# Patient Record
Sex: Female | Born: 1980 | Race: White | Hispanic: No | Marital: Married | Smoking: Never smoker
Health system: Southern US, Community
[De-identification: ages and names within clinical notes are randomized; demographics above are authoritative.]

## PROBLEM LIST (undated history)

## (undated) DIAGNOSIS — Z789 Other specified health status: Secondary | ICD-10-CM

## (undated) HISTORY — PX: NO PAST SURGERIES: SHX2092

---

## 2014-01-07 LAB — OB RESULTS CONSOLE HEPATITIS B SURFACE ANTIGEN: HEP B S AG: NEGATIVE

## 2014-01-07 LAB — OB RESULTS CONSOLE ABO/RH: RH Type: POSITIVE

## 2014-01-07 LAB — OB RESULTS CONSOLE HIV ANTIBODY (ROUTINE TESTING): HIV: NONREACTIVE

## 2014-01-07 LAB — OB RESULTS CONSOLE ANTIBODY SCREEN: Antibody Screen: NEGATIVE

## 2014-01-07 LAB — OB RESULTS CONSOLE RUBELLA ANTIBODY, IGM: RUBELLA: IMMUNE

## 2014-01-07 LAB — OB RESULTS CONSOLE RPR: RPR: NONREACTIVE

## 2014-04-16 NOTE — L&D Delivery Note (Signed)
SVD of VFI at 1729 on 07/19/14.  EBL 250cc.  APGARs 9,9.  Placenta to L&D. Head delivered LOA and body delivered atraumatically.  Cord was clamped, cut and baby to abdomen.  Cord blood was obtained.  Placenta delivered S/I/3VC.  Fundus was firmed with pitocin and massage.  2nd degree perineal lac was repaired in the normal fashion.  Mom and baby stable.    Mitchel HonourMegan Nira Visscher, DO

## 2014-06-15 LAB — OB RESULTS CONSOLE GBS: STREP GROUP B AG: NEGATIVE

## 2014-06-30 ENCOUNTER — Inpatient Hospital Stay (HOSPITAL_COMMUNITY): Admission: AD | Admit: 2014-06-30 | Payer: Self-pay | Source: Ambulatory Visit | Admitting: Obstetrics & Gynecology

## 2014-07-14 ENCOUNTER — Encounter (HOSPITAL_COMMUNITY): Payer: Self-pay | Admitting: *Deleted

## 2014-07-14 ENCOUNTER — Telehealth (HOSPITAL_COMMUNITY): Payer: Self-pay | Admitting: *Deleted

## 2014-07-14 NOTE — Telephone Encounter (Signed)
Preadmission screen  

## 2014-07-19 ENCOUNTER — Encounter (HOSPITAL_COMMUNITY): Payer: Self-pay

## 2014-07-19 ENCOUNTER — Inpatient Hospital Stay (HOSPITAL_COMMUNITY): Payer: Managed Care, Other (non HMO) | Admitting: Anesthesiology

## 2014-07-19 ENCOUNTER — Inpatient Hospital Stay (HOSPITAL_COMMUNITY)
Admission: RE | Admit: 2014-07-19 | Discharge: 2014-07-21 | DRG: 775 | Disposition: A | Discharge status: Home / Self Care | Payer: Managed Care, Other (non HMO) | Source: Ambulatory Visit | Attending: Obstetrics & Gynecology | Admitting: Obstetrics & Gynecology

## 2014-07-19 DIAGNOSIS — Z3A41 41 weeks gestation of pregnancy: Secondary | ICD-10-CM | POA: Diagnosis present

## 2014-07-19 DIAGNOSIS — Z823 Family history of stroke: Secondary | ICD-10-CM

## 2014-07-19 DIAGNOSIS — Z833 Family history of diabetes mellitus: Secondary | ICD-10-CM | POA: Diagnosis not present

## 2014-07-19 DIAGNOSIS — O480 Post-term pregnancy: Secondary | ICD-10-CM | POA: Diagnosis present

## 2014-07-19 DIAGNOSIS — Z3490 Encounter for supervision of normal pregnancy, unspecified, unspecified trimester: Secondary | ICD-10-CM

## 2014-07-19 HISTORY — DX: Other specified health status: Z78.9

## 2014-07-19 LAB — CBC
HCT: 34.1 % — ABNORMAL LOW (ref 36.0–46.0)
HEMOGLOBIN: 11.4 g/dL — AB (ref 12.0–15.0)
MCH: 29.9 pg (ref 26.0–34.0)
MCHC: 33.4 g/dL (ref 30.0–36.0)
MCV: 89.5 fL (ref 78.0–100.0)
Platelets: 235 10*3/uL (ref 150–400)
RBC: 3.81 MIL/uL — AB (ref 3.87–5.11)
RDW: 13 % (ref 11.5–15.5)
WBC: 11.6 10*3/uL — AB (ref 4.0–10.5)

## 2014-07-19 LAB — TYPE AND SCREEN
ABO/RH(D): B POS
Antibody Screen: NEGATIVE

## 2014-07-19 LAB — ABO/RH: ABO/RH(D): B POS

## 2014-07-19 LAB — RPR: RPR Ser Ql: NONREACTIVE

## 2014-07-19 MED ORDER — EPHEDRINE 5 MG/ML INJ
10.0000 mg | INTRAVENOUS | Status: DC | PRN
  Filled 2014-07-19: qty 2

## 2014-07-19 MED ORDER — LIDOCAINE HCL (PF) 1 % IJ SOLN
30.0000 mL | INTRAMUSCULAR | Status: DC | PRN
  Filled 2014-07-19 (×2): qty 30

## 2014-07-19 MED ORDER — TETANUS-DIPHTH-ACELL PERTUSSIS 5-2.5-18.5 LF-MCG/0.5 IM SUSP: 0.5000 mL | Freq: Once | INTRAMUSCULAR | Status: DC

## 2014-07-19 MED ORDER — ZOLPIDEM TARTRATE 5 MG PO TABS: 5.0000 mg | ORAL_TABLET | Freq: Every evening | ORAL | Status: DC | PRN

## 2014-07-19 MED ORDER — PHENYLEPHRINE 40 MCG/ML (10ML) SYRINGE FOR IV PUSH (FOR BLOOD PRESSURE SUPPORT)
80.0000 ug | PREFILLED_SYRINGE | INTRAVENOUS | Status: DC | PRN
  Filled 2014-07-19: qty 2

## 2014-07-19 MED ORDER — LIDOCAINE HCL (PF) 1 % IJ SOLN
INTRAMUSCULAR | Status: DC | PRN
  Administered 2014-07-19 (×2): 8 mL

## 2014-07-19 MED ORDER — PRENATAL MULTIVITAMIN CH
1.0000 | ORAL_TABLET | Freq: Every day | ORAL | Status: DC
  Administered 2014-07-20: 1 via ORAL
  Filled 2014-07-19: qty 1

## 2014-07-19 MED ORDER — SENNOSIDES-DOCUSATE SODIUM 8.6-50 MG PO TABS
2.0000 | ORAL_TABLET | ORAL | Status: DC
  Administered 2014-07-20 (×2): 2 via ORAL
  Filled 2014-07-19 (×2): qty 2

## 2014-07-19 MED ORDER — OXYTOCIN BOLUS FROM INFUSION: 500.0000 mL | INTRAVENOUS | Status: DC

## 2014-07-19 MED ORDER — DIBUCAINE 1 % RE OINT: 1.0000 "application " | TOPICAL_OINTMENT | RECTAL | Status: DC | PRN

## 2014-07-19 MED ORDER — SIMETHICONE 80 MG PO CHEW: 80.0000 mg | CHEWABLE_TABLET | ORAL | Status: DC | PRN

## 2014-07-19 MED ORDER — CITRIC ACID-SODIUM CITRATE 334-500 MG/5ML PO SOLN: 30.0000 mL | ORAL | Status: DC | PRN

## 2014-07-19 MED ORDER — LACTATED RINGERS IV SOLN: INTRAVENOUS | Status: DC

## 2014-07-19 MED ORDER — OXYCODONE-ACETAMINOPHEN 5-325 MG PO TABS
2.0000 | ORAL_TABLET | ORAL | Status: DC | PRN
Start: 2014-07-19 — End: 2014-07-21

## 2014-07-19 MED ORDER — FENTANYL 2.5 MCG/ML BUPIVACAINE 1/10 % EPIDURAL INFUSION (WH - ANES)
14.0000 mL/h | INTRAMUSCULAR | Status: DC | PRN
  Filled 2014-07-19: qty 125

## 2014-07-19 MED ORDER — LANOLIN HYDROUS EX OINT: TOPICAL_OINTMENT | CUTANEOUS | Status: DC | PRN

## 2014-07-19 MED ORDER — ACETAMINOPHEN 325 MG PO TABS: 650.0000 mg | ORAL_TABLET | ORAL | Status: DC | PRN

## 2014-07-19 MED ORDER — DIPHENHYDRAMINE HCL 50 MG/ML IJ SOLN: 12.5000 mg | INTRAMUSCULAR | Status: DC | PRN

## 2014-07-19 MED ORDER — ACETAMINOPHEN 325 MG PO TABS
650.0000 mg | ORAL_TABLET | ORAL | Status: DC | PRN
  Administered 2014-07-20 (×3): 650 mg via ORAL
  Filled 2014-07-19 (×3): qty 2

## 2014-07-19 MED ORDER — DIPHENHYDRAMINE HCL 25 MG PO CAPS: 25.0000 mg | ORAL_CAPSULE | Freq: Four times a day (QID) | ORAL | Status: DC | PRN

## 2014-07-19 MED ORDER — ONDANSETRON HCL 4 MG/2ML IJ SOLN: 4.0000 mg | INTRAMUSCULAR | Status: DC | PRN

## 2014-07-19 MED ORDER — LACTATED RINGERS IV SOLN: 500.0000 mL | Freq: Once | INTRAVENOUS | Status: DC

## 2014-07-19 MED ORDER — ONDANSETRON HCL 4 MG PO TABS: 4.0000 mg | ORAL_TABLET | ORAL | Status: DC | PRN

## 2014-07-19 MED ORDER — TERBUTALINE SULFATE 1 MG/ML IJ SOLN
0.2500 mg | Freq: Once | INTRAMUSCULAR | Status: DC | PRN
  Filled 2014-07-19: qty 1

## 2014-07-19 MED ORDER — OXYTOCIN 40 UNITS IN LACTATED RINGERS INFUSION - SIMPLE MED
1.0000 m[IU]/min | INTRAVENOUS | Status: DC
  Administered 2014-07-19: 2 m[IU]/min via INTRAVENOUS
  Filled 2014-07-19: qty 1000

## 2014-07-19 MED ORDER — OXYCODONE-ACETAMINOPHEN 5-325 MG PO TABS: 2.0000 | ORAL_TABLET | ORAL | Status: DC | PRN

## 2014-07-19 MED ORDER — PHENYLEPHRINE 40 MCG/ML (10ML) SYRINGE FOR IV PUSH (FOR BLOOD PRESSURE SUPPORT)
80.0000 ug | PREFILLED_SYRINGE | INTRAVENOUS | Status: DC | PRN
  Filled 2014-07-19: qty 2
  Filled 2014-07-19: qty 20

## 2014-07-19 MED ORDER — OXYCODONE-ACETAMINOPHEN 5-325 MG PO TABS: 1.0000 | ORAL_TABLET | ORAL | Status: DC | PRN

## 2014-07-19 MED ORDER — WITCH HAZEL-GLYCERIN EX PADS: 1.0000 "application " | MEDICATED_PAD | CUTANEOUS | Status: DC | PRN

## 2014-07-19 MED ORDER — BENZOCAINE-MENTHOL 20-0.5 % EX AERO: 1.0000 "application " | INHALATION_SPRAY | CUTANEOUS | Status: DC | PRN

## 2014-07-19 MED ORDER — FLEET ENEMA 7-19 GM/118ML RE ENEM: 1.0000 | ENEMA | RECTAL | Status: DC | PRN

## 2014-07-19 MED ORDER — LACTATED RINGERS IV SOLN: 500.0000 mL | INTRAVENOUS | Status: DC | PRN

## 2014-07-19 MED ORDER — FENTANYL 2.5 MCG/ML BUPIVACAINE 1/10 % EPIDURAL INFUSION (WH - ANES)
INTRAMUSCULAR | Status: DC | PRN
  Administered 2014-07-19: 14 mL/h via EPIDURAL

## 2014-07-19 MED ORDER — ONDANSETRON HCL 4 MG/2ML IJ SOLN: 4.0000 mg | Freq: Four times a day (QID) | INTRAMUSCULAR | Status: DC | PRN

## 2014-07-19 MED ORDER — OXYTOCIN 40 UNITS IN LACTATED RINGERS INFUSION - SIMPLE MED: 62.5000 mL/h | INTRAVENOUS | Status: DC

## 2014-07-19 MED ORDER — OXYCODONE-ACETAMINOPHEN 5-325 MG PO TABS
1.0000 | ORAL_TABLET | ORAL | Status: DC | PRN
Start: 2014-07-19 — End: 2014-07-21

## 2014-07-19 NOTE — Lactation Note (Signed)
This note was copied from the chart of Ashlee Cheri GuppySarah Abruzzese. Lactation Consultation Note Initial visit at 4 hours of age.  Mom has 14 months experience with older child breastfeeding.  Mom reports this baby has had 2 feedings and does well with latching deeply.  Mom denies pain or concerns.  Baby asleep in mom's arms.  [facility] given and discussed.  Encouraged to feed with early cues on demand.  Early newborn behavior discussed.  Hand expression demonstrated with colostrum visible.  Mom to call for assist as needed.     Patient Name: Ashlee Phillips ZOXWR'UToday's Date: 07/19/2014 Reason for consult: Initial assessment   Maternal Data Formula Feeding for Exclusion: No Has patient been taught Hand Expression?: Yes Does the patient have breastfeeding experience prior to this delivery?: Yes  Feeding Feeding Type: Breast Fed Length of feed: 30 min  LATCH Score/Interventions                      Lactation Tools Discussed/Used     Consult Status Consult Status: Follow-up Date: 07/20/14 Follow-up type: In-patient    Jannifer RodneyShoptaw, Jana Lynn 07/19/2014, 10:23 PM

## 2014-07-19 NOTE — Anesthesia Procedure Notes (Signed)
Epidural Patient location during procedure: OB Start time: 07/19/2014 2:05 PM End time: 07/19/2014 2:09 PM  Staffing Anesthesiologist: Leilani AbleHATCHETT, Milfred Krammes Performed by: anesthesiologist   Preanesthetic Checklist Completed: patient identified, surgical consent, pre-op evaluation, timeout performed, IV checked, risks and benefits discussed and monitors and equipment checked  Epidural Patient position: sitting Prep: site prepped and draped and DuraPrep Patient monitoring: continuous pulse ox and blood pressure Approach: midline Location: L3-L4 Injection technique: LOR air  Needle:  Needle type: Tuohy  Needle gauge: 17 G Needle length: 9 cm and 9 Needle insertion depth: 5 cm cm Catheter type: closed end flexible Catheter size: 19 Gauge Catheter at skin depth: 10 cm Test dose: negative and Other  Assessment Sensory level: T8 Events: blood not aspirated, injection not painful, no injection resistance, negative IV test and no paresthesia  Additional Notes Reason for block:procedure for pain

## 2014-07-19 NOTE — Anesthesia Preprocedure Evaluation (Signed)

## 2014-07-19 NOTE — H&P (Signed)
Ashlee GuppySarah Phillips is a 34 y.o. female presenting for post-dates IOL.  Antepartum course complicated by h/o 4th degree laceration; declines elective primary C/S.  H/O macrosomia with last pregnancy; u/s 3/1 showed 72% growth (6#7).  GBS negative.    Maternal Medical History:  Fetal activity: Perceived fetal activity is normal.   Last perceived fetal movement was within the past hour.    Prenatal complications: no prenatal complications Prenatal Complications - Diabetes: none.    OB History    Gravida Para Term Preterm AB TAB SAB Ectopic Multiple Living   2 1 1       1      Past Medical History  Diagnosis Date  . Medical history non-contributory    Past Surgical History  Procedure Laterality Date  . No past surgeries     Family History: family history includes Cancer in her paternal grandfather; Diabetes in her father and paternal grandfather; Stroke in her paternal grandfather. Social History:  reports that she has never smoked. She does not have any smokeless tobacco history on file. She reports that she does not drink alcohol or use illicit drugs.   Prenatal Transfer Tool  Maternal Diabetes: No Genetic Screening: Normal Maternal Ultrasounds/Referrals: Normal Fetal Ultrasounds or other Referrals:  None Maternal Substance Abuse:  No Significant Maternal Medications:  None Significant Maternal Lab Results:  Lab values include: Group B Strep negative Other Comments:  None  ROS  Dilation: 3 Effacement (%): 50 Station: -2 Exam by:: dr Langston Maskermorris  Blood pressure 111/69, pulse 95, temperature 98.4 F (36.9 C), temperature source Oral, resp. rate 20, height 5\' 5"  (1.651 m), weight 161 lb (73.029 kg), last menstrual period 10/05/2013. Maternal Exam:  Uterine Assessment: Contraction strength is mild.  Contraction frequency is rare.   Abdomen: Patient reports no abdominal tenderness. Fundal height is c/w dates.   Estimated fetal weight is 8#.   Fetal presentation: vertex  Introitus:  Normal vulva. Amniotic fluid character: clear.  Pelvis: adequate for delivery.   Cervix: Cervix evaluated by digital exam.     Physical Exam  Constitutional: She is oriented to person, place, and time. She appears well-developed and well-nourished.  GI: Soft. There is no rebound and no guarding.  Neurological: She is alert and oriented to person, place, and time.  Skin: Skin is warm and dry.  Psychiatric: She has a normal mood and affect. Her behavior is normal.    Prenatal labs: ABO, Rh: B/Positive/-- (09/24 0000) Antibody: Negative (09/24 0000) Rubella: Immune (09/24 0000) RPR: Nonreactive (09/24 0000)  HBsAg: Negative (09/24 0000)  HIV: Non-reactive (09/24 0000)  GBS: Negative (03/01 0000)   Assessment/Plan: 33yo G2P1001 at [id number] for PDI -AROM performed -Start pitocin as needed -Epidural if desired -GBS negative   Ashlee Phillips 07/19/2014, 8:24 AM

## 2014-07-20 LAB — CBC
HEMATOCRIT: 32.8 % — AB (ref 36.0–46.0)
HEMOGLOBIN: 10.6 g/dL — AB (ref 12.0–15.0)
MCH: 29.1 pg (ref 26.0–34.0)
MCHC: 32.3 g/dL (ref 30.0–36.0)
MCV: 90.1 fL (ref 78.0–100.0)
PLATELETS: 220 10*3/uL (ref 150–400)
RBC: 3.64 MIL/uL — AB (ref 3.87–5.11)
RDW: 13.1 % (ref 11.5–15.5)
WBC: 18.5 10*3/uL — ABNORMAL HIGH (ref 4.0–10.5)

## 2014-07-20 NOTE — Progress Notes (Signed)
Post Partum Day 1 Subjective: no complaints, up ad lib, voiding, tolerating PO and + flatus  Objective: Blood pressure 114/70, pulse 76, temperature 98.4 F (36.9 C), temperature source Oral, resp. rate 18, height 5\' 5"  (1.651 m), weight 161 lb (73.029 kg), last menstrual period 10/05/2013, SpO2 98 %, unknown if currently breastfeeding.  Physical Exam:  General: alert and cooperative Lochia: appropriate Uterine Fundus: firm Incision: healing well DVT Evaluation: No evidence of DVT seen on physical exam. Negative Homan's sign. No cords or calf tenderness. No significant calf/ankle edema.   Recent Labs  07/19/14 0745 07/20/14 0546  HGB 11.4* 10.6*  HCT 34.1* 32.8*    Assessment/Plan: Plan for discharge tomorrow   LOS: 1 day   CURTIS,CAROL G 07/20/2014, 8:30 AM

## 2014-07-20 NOTE — Anesthesia Postprocedure Evaluation (Signed)
  Anesthesia Post-op Note  Patient: Ashlee GuppySarah Phillips  Procedure(s) Performed: * No procedures listed *  Patient Location: Mother/Baby  Anesthesia Type:Epidural  Level of Consciousness: awake, alert , oriented and patient cooperative  Airway and Oxygen Therapy: Patient Spontanous Breathing  Post-op Pain: mild  Post-op Assessment: Post-op Vital signs reviewed, Patient's Cardiovascular Status Stable, Respiratory Function Stable, Patent Airway, No signs of Nausea or vomiting, Adequate PO intake, Pain level controlled, No headache, No backache, No residual numbness and No residual motor weakness  Post-op Vital Signs: Reviewed and stable  Last Vitals:  Filed Vitals:   07/20/14 0916  BP: 115/74  Pulse: 83  Temp: 36.8 C  Resp: 18    Complications: No apparent anesthesia complications

## 2014-07-21 ENCOUNTER — Ambulatory Visit: Payer: Self-pay

## 2014-07-21 NOTE — Lactation Note (Signed)
This note was copied from the chart of Ashlee Cheri GuppySarah Jicha. Lactation Consultation Note: Mother was seen piror to discharge. Discussed need to continue to feed infant 8-12 times and with feeding cues. Mother informed of treatment plan to prevent severe engorgement. Mother receptive to all teaching. Mother is aware of available LC services.   Patient Name: Ashlee Phillips ZOXWR'UToday's Date: 07/21/2014 Reason for consult: Follow-up assessment   Maternal Data    Feeding Feeding Type: Breast Fed Length of feed: 20 min  LATCH Score/Interventions                      Lactation Tools Discussed/Used     Consult Status Consult Status: Complete    Michel BickersKendrick, Teneka Malmberg McCoy 07/21/2014, 12:24 PM

## 2014-07-21 NOTE — Discharge Summary (Signed)
Obstetric Discharge Summary Reason for Admission: induction of labor Prenatal Procedures: ultrasound Intrapartum Procedures: spontaneous vaginal delivery Postpartum Procedures: none Complications-Operative and Postpartum: 2 degree perineal laceration HEMOGLOBIN  Date Value Ref Range Status  07/20/2014 10.6* 12.0 - 15.0 g/dL Final   HCT  Date Value Ref Range Status  07/20/2014 32.8* 36.0 - 46.0 % Final    Physical Exam:  General: alert and cooperative Lochia: appropriate Uterine Fundus: firm Incision: healing well DVT Evaluation: No evidence of DVT seen on physical exam. Negative Homan's sign. No cords or calf tenderness. No significant calf/ankle edema.  Discharge Diagnoses: Term Pregnancy-delivered  Discharge Information: Date: 07/21/2014 Activity: pelvic rest Diet: routine Medications: PNV Condition: stable Instructions: refer to practice specific booklet Discharge to: home   Newborn Data: Live born female  Birth Weight: 8 lb 11.7 oz (3960 g) APGAR: 9, 9  Home with mother.  CURTIS,CAROL G 07/21/2014, 8:46 AM

## 2015-03-15 ENCOUNTER — Other Ambulatory Visit: Payer: Self-pay | Admitting: Nurse Practitioner

## 2015-03-15 DIAGNOSIS — N6320 Unspecified lump in the left breast, unspecified quadrant: Secondary | ICD-10-CM

## 2015-03-22 ENCOUNTER — Ambulatory Visit
Admission: RE | Admit: 2015-03-22 | Discharge: 2015-03-22 | Disposition: A | Discharge status: Home / Self Care | Payer: Managed Care, Other (non HMO) | Source: Ambulatory Visit | Attending: Nurse Practitioner | Admitting: Nurse Practitioner

## 2015-03-22 DIAGNOSIS — N6320 Unspecified lump in the left breast, unspecified quadrant: Secondary | ICD-10-CM

## 2015-03-23 ENCOUNTER — Other Ambulatory Visit: Payer: Self-pay | Admitting: Nurse Practitioner

## 2015-03-23 DIAGNOSIS — R2232 Localized swelling, mass and lump, left upper limb: Secondary | ICD-10-CM

## 2015-03-23 DIAGNOSIS — R222 Localized swelling, mass and lump, trunk: Secondary | ICD-10-CM

## 2015-03-30 ENCOUNTER — Ambulatory Visit
Admission: RE | Admit: 2015-03-30 | Discharge: 2015-03-30 | Disposition: A | Discharge status: Home / Self Care | Payer: Managed Care, Other (non HMO) | Source: Ambulatory Visit | Attending: Nurse Practitioner | Admitting: Nurse Practitioner

## 2015-03-30 DIAGNOSIS — R2232 Localized swelling, mass and lump, left upper limb: Secondary | ICD-10-CM

## 2015-03-30 MED ORDER — GADOBENATE DIMEGLUMINE 529 MG/ML IV SOLN
10.0000 mL | Freq: Once | INTRAVENOUS | Status: AC | PRN
  Administered 2015-03-30: 10 mL via INTRAVENOUS

## 2015-04-05 ENCOUNTER — Other Ambulatory Visit: Payer: Managed Care, Other (non HMO)

## 2015-06-22 ENCOUNTER — Other Ambulatory Visit: Payer: Self-pay | Admitting: Nurse Practitioner

## 2015-06-22 DIAGNOSIS — N63 Unspecified lump in unspecified breast: Secondary | ICD-10-CM

## 2015-06-30 ENCOUNTER — Other Ambulatory Visit: Payer: Self-pay | Admitting: Obstetrics & Gynecology

## 2015-06-30 DIAGNOSIS — N63 Unspecified lump in unspecified breast: Secondary | ICD-10-CM

## 2015-07-01 ENCOUNTER — Ambulatory Visit
Admission: RE | Admit: 2015-07-01 | Discharge: 2015-07-01 | Disposition: A | Discharge status: Home / Self Care | Payer: Managed Care, Other (non HMO) | Source: Ambulatory Visit | Attending: Nurse Practitioner | Admitting: Nurse Practitioner

## 2015-07-01 DIAGNOSIS — N63 Unspecified lump in unspecified breast: Secondary | ICD-10-CM

## 2016-12-14 IMAGING — MR MR BREAST BILATERAL W WO CONTRAST
8 of 13 series · 31 of 48 positions shown · IV contrast (multihance)
Comparison: Ultrasound, 03/22/2015.

CLINICAL DATA: Patient is breast feeding. She has a new palpable
lump in the low left axillary region with mild associated
tenderness. Ultrasound of this showed a hypoechoic lesion measuring
3.3 x 0.8 by 2.4 cm. This was felt likely to be a lipoma. MRI is for
further assessment.

LABS:  Not obtained
EXAM:
BILATERAL BREAST MRI WITH AND WITHOUT CONTRAST
TECHNIQUE: Multiplanar, multisequence MR images of both breasts were obtained
prior to and following the intravenous administration of 10 ml of
MultiHance.

[Series 2: T2 · axial · 3.0mm · 0.94mm/px · z∈[-82,+80]mm · 3 of 55 slices shown]
[im 1/55]
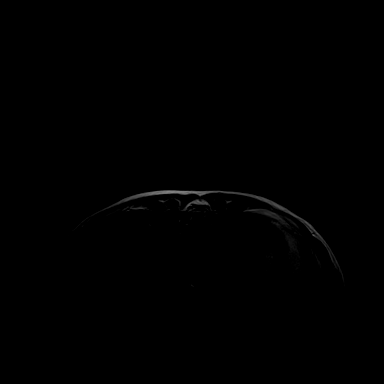
[im 28/55]
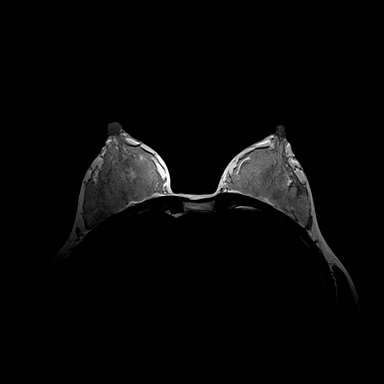
[im 55/55]
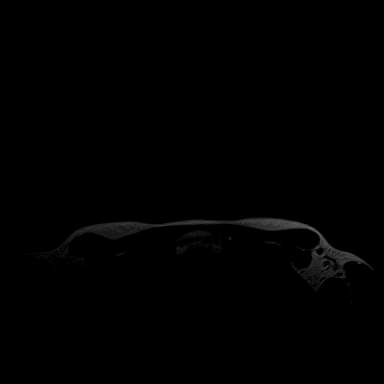

[Series 3: t2_tirm_tra ipat (a-p) · axial · 3.0mm · 0.70mm/px · z∈[-82,+80]mm · 2 of 55 slices shown]
[im 1/55]
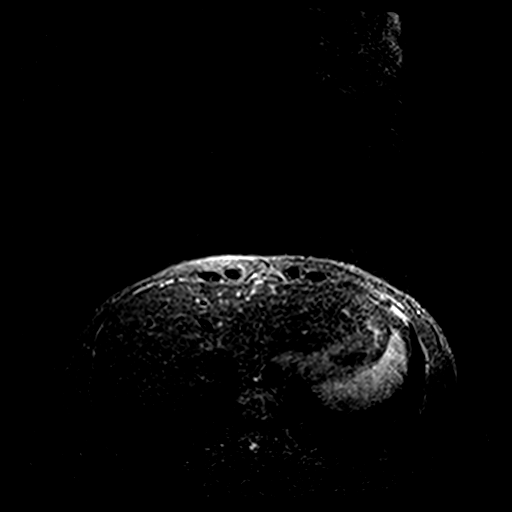
[im 55/55]
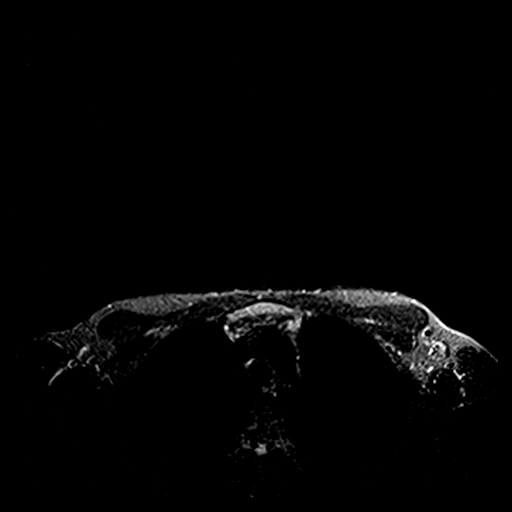

[Series 4: fl3d pre-cm no · axial · non-contrast · 1.2mm · 0.94mm/px · z∈[-87,+85]mm · 5 of 144 slices shown]
[im 1/144]
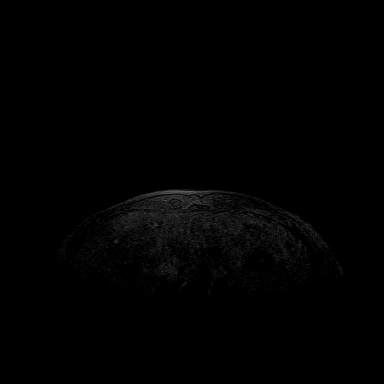
[im 36/144]
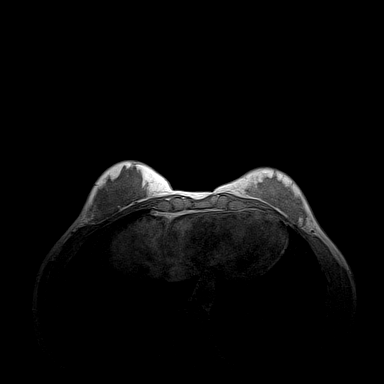
[im 72/144]
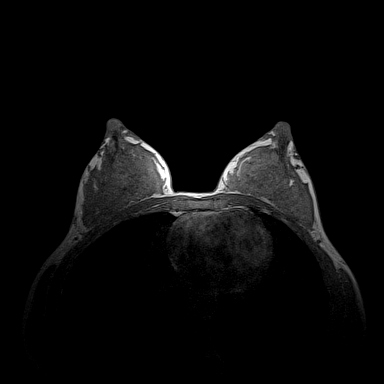
[im 108/144]
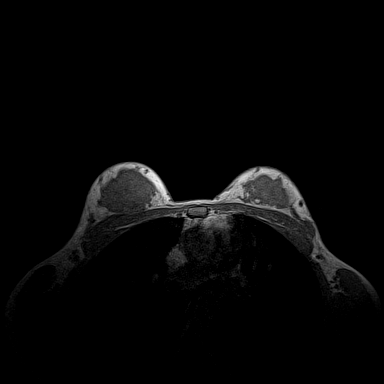
[im 144/144]
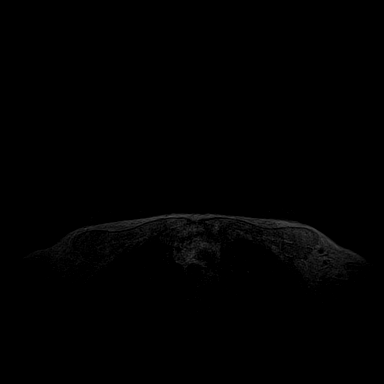

[Series 5: fl3d pre-cm · axial · non-contrast · 1.2mm · 0.94mm/px · z∈[-87,+85]mm · 5 of 144 slices shown]
[im 1/144]
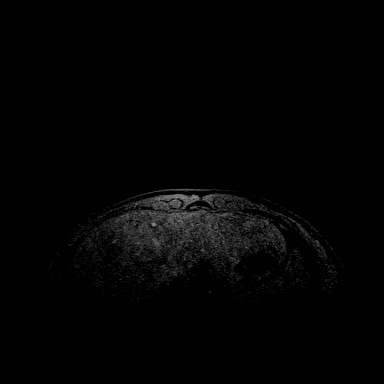
[im 36/144]
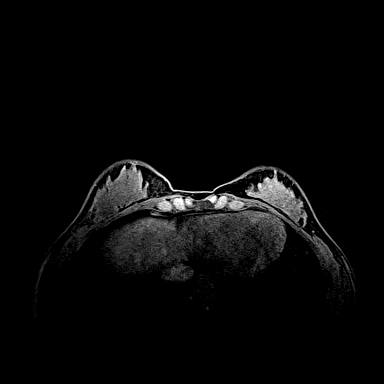
[im 72/144]
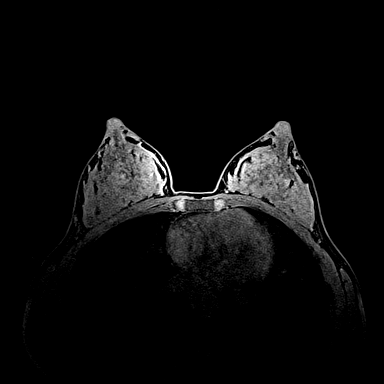
[im 108/144]
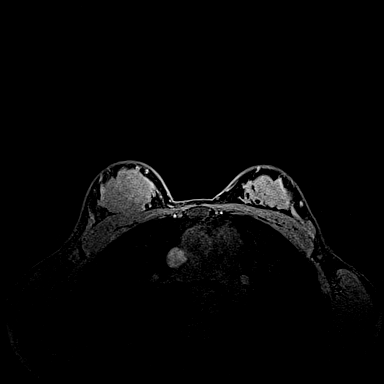
[im 144/144]
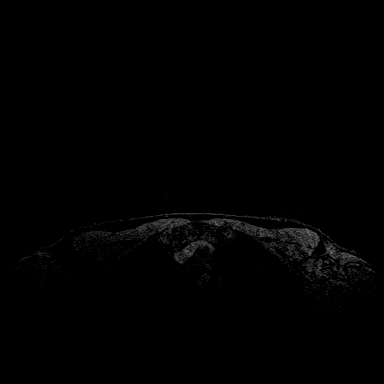

[Series 6: fl3d post-cm 20 · axial · 1.2mm · 0.94mm/px · z∈[-87,+85]mm · 5 of 144 slices shown (1 of 3)]
[im 1/144]
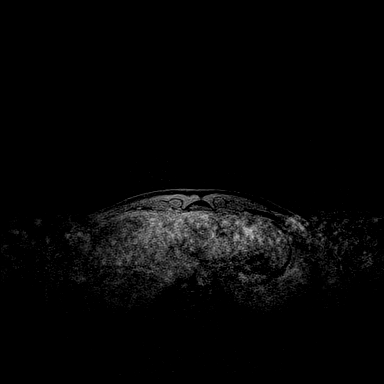
[im 36/144]
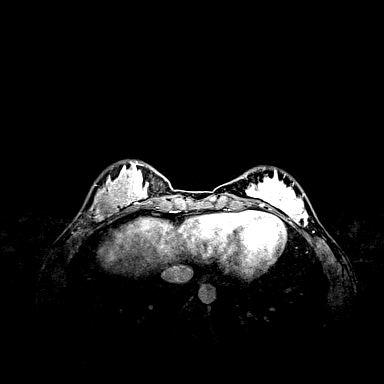
[im 72/144]
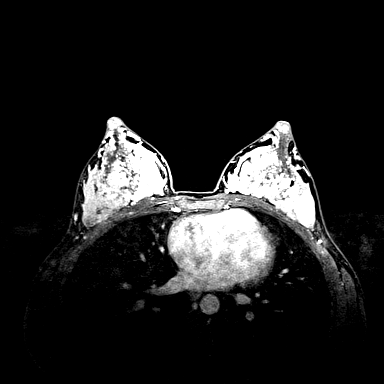
[im 108/144]
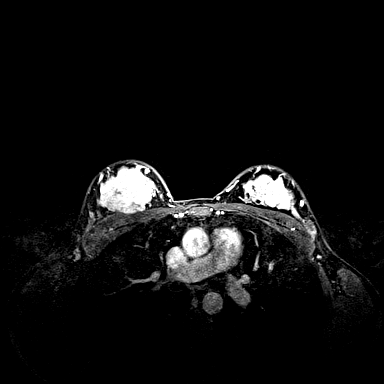
[im 144/144]
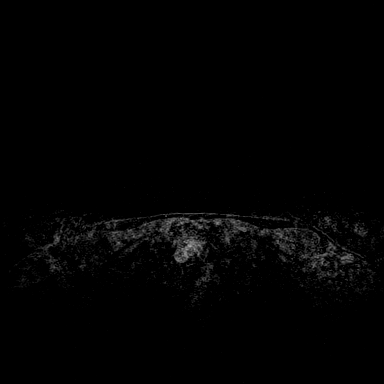

[Series 7: fl3d post-cm 20 · axial · 1.2mm · 0.94mm/px · z∈[-87,+85]mm · 5 of 144 slices shown (2 of 3)]
[im 1/144]
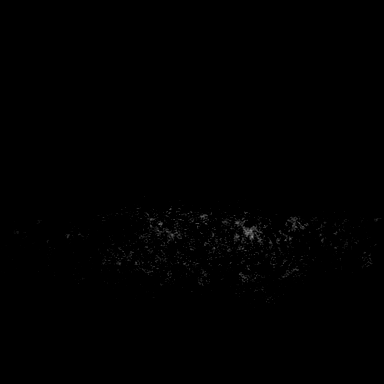
[im 36/144]
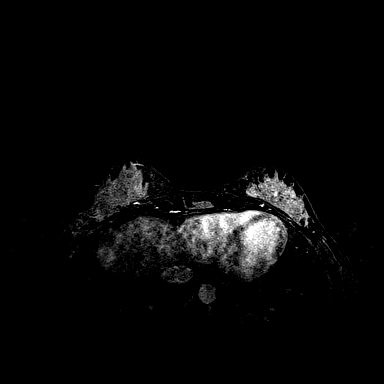
[im 72/144]
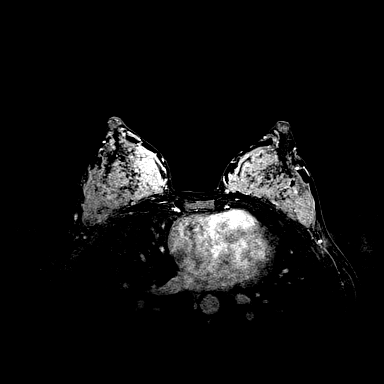
[im 108/144]
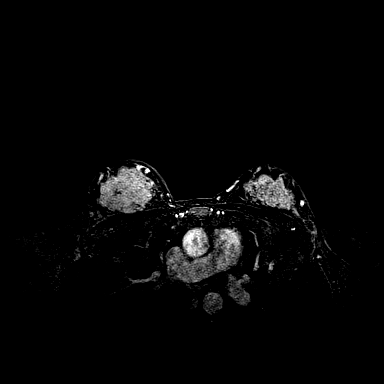
[im 144/144]
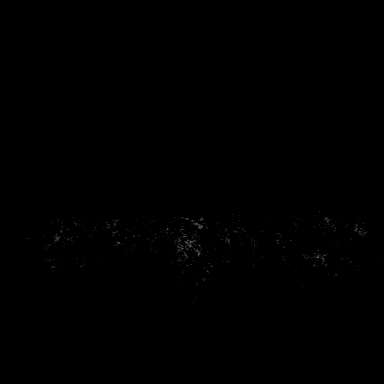

[Series 8: fl3d post-cm 20 · axial · 172.8mm · 0.94mm/px · 1 of 1 slices shown (3 of 3)]
[im 1/1]
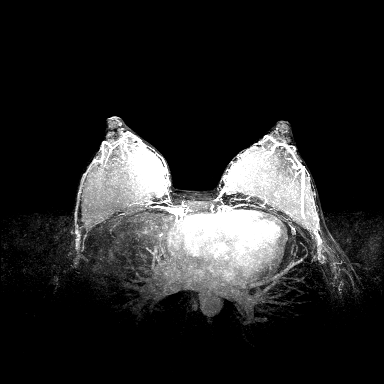

[Series 9: fl3d post-cm 3min · axial · 1.2mm · 0.94mm/px · z∈[-87,+85]mm · 5 of 144 slices shown]
[im 1/144]
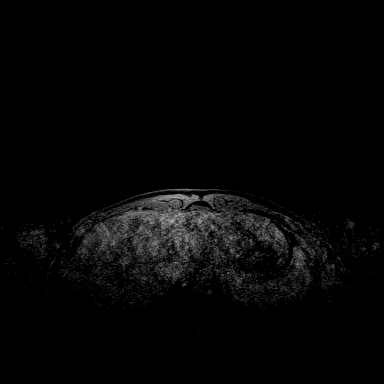
[im 36/144]
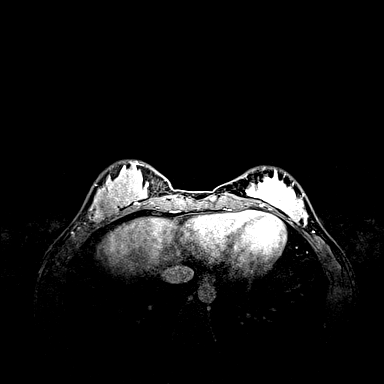
[im 72/144]
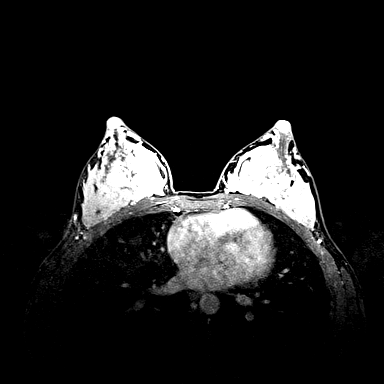
[im 108/144]
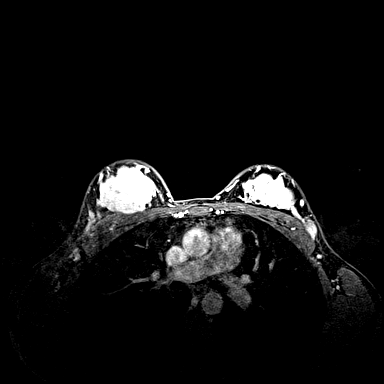
[im 144/144]
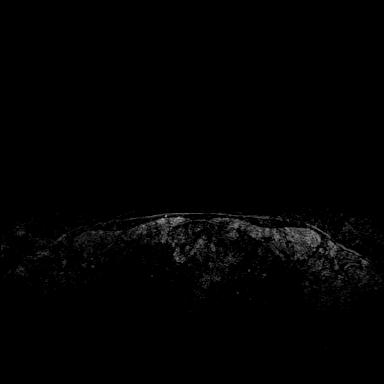

[31 of 48 positions shown; findings below may reference images not displayed]

THREE-DIMENSIONAL MR IMAGE RENDERING ON INDEPENDENT WORKSTATION:

Three-dimensional MR images were rendered by post-processing of the
original MR data on an independent workstation. The
three-dimensional MR images were interpreted, and findings are
reported in the following complete MRI report for this study. Three
dimensional images were evaluated at the independent [employer]
workstation
FINDINGS: Breast composition: d. Extreme fibroglandular tissue.

Background parenchymal enhancement: Marked

Right breast: No mass or abnormal enhancement.

Left breast: No mass or abnormal enhancement.

Axilla/ Lymph nodes: On the left, there is an oval mass that extends
from the axillary left breast tissue to the left axilla. The mass is
relatively circumscribed, measuring 2.1 x 0.9 x 1.9 cm. On T1
weighted imaging, the mass is mildly hypointense relative to
fibroglandular tissue, similar in intensity to skeletal muscle. On
T2 weighted imaging this shows mixed attenuation with areas of
intermediate signal and areas of high-signal with a relatively low
signal intensity peripheral rim. On STIR imaging, lesion is
increased in signal when compared to normal fibroglandular tissue.
On post-contrast imaging there is mild heterogeneous enhancement
within this lesion mostly along its periphery, less than seen
throughout the fibroglandular tissue of both breasts. No other
axillary abnormality.

Ancillary findings:  None.
IMPRESSION: 1. No breast mass or area of abnormal breast enhancement to suggest
breast malignancy.
2. There is a 2.1 cm oval mass in the left axilla that abuts the
upper-outer aspect of the left breast tissue. This is not a lipoma.
Signal characteristics from this mass are nonspecific. It may
reflect an inflamed, mildly enlarged lymph node. It could reflect an
area of inflamed, axillary tail, fibroglandular tissue. Neoplastic
disease is felt unlikely. Short-term follow-up is recommended with
repeat left axillary region ultrasound in 3 months.

RECOMMENDATION:
Repeat left axillary ultrasound in 3 months to reassess the size of
the axillary region mass.

BI-RADS CATEGORY  3: Probably benign.
